# Patient Record
Sex: Male | Born: 1985 | Race: White | Hispanic: No | Marital: Single | Smoking: Current every day smoker
Health system: Southern US, Community
[De-identification: ages and names within clinical notes are randomized; demographics above are authoritative.]

## PROBLEM LIST (undated history)

## (undated) DIAGNOSIS — B019 Varicella without complication: Secondary | ICD-10-CM

## (undated) HISTORY — PX: OTHER SURGICAL HISTORY: SHX169

## (undated) HISTORY — PX: WISDOM TOOTH EXTRACTION: SHX21

## (undated) HISTORY — DX: Varicella without complication: B01.9

---
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
## 2008-06-09 ENCOUNTER — Emergency Department (HOSPITAL_COMMUNITY): Admission: EM | Admit: 2008-06-09 | Discharge: 2008-06-09 | Payer: Self-pay | Admitting: Family Medicine

## 2008-07-13 ENCOUNTER — Emergency Department (HOSPITAL_COMMUNITY): Admission: EM | Admit: 2008-07-13 | Discharge: 2008-07-13 | Payer: Self-pay | Admitting: Emergency Medicine

## 2008-07-17 ENCOUNTER — Emergency Department (HOSPITAL_COMMUNITY): Admission: EM | Admit: 2008-07-17 | Discharge: 2008-07-17 | Payer: Self-pay | Admitting: Emergency Medicine

## 2008-08-27 ENCOUNTER — Emergency Department (HOSPITAL_COMMUNITY): Admission: EM | Admit: 2008-08-27 | Discharge: 2008-08-27 | Payer: Self-pay | Admitting: Emergency Medicine

## 2008-10-18 ENCOUNTER — Emergency Department (HOSPITAL_COMMUNITY): Admission: EM | Admit: 2008-10-18 | Discharge: 2008-10-18 | Payer: Self-pay | Admitting: Emergency Medicine

## 2008-10-30 ENCOUNTER — Emergency Department (HOSPITAL_COMMUNITY): Admission: EM | Admit: 2008-10-30 | Discharge: 2008-10-30 | Payer: Self-pay | Admitting: Emergency Medicine

## 2009-01-29 ENCOUNTER — Emergency Department (HOSPITAL_COMMUNITY): Admission: EM | Admit: 2009-01-29 | Discharge: 2009-01-29 | Payer: Self-pay | Admitting: Emergency Medicine

## 2009-11-02 ENCOUNTER — Emergency Department (HOSPITAL_COMMUNITY): Admission: EM | Admit: 2009-11-02 | Discharge: 2009-11-02 | Payer: Self-pay | Admitting: Emergency Medicine

## 2009-11-13 ENCOUNTER — Emergency Department (HOSPITAL_COMMUNITY): Admission: EM | Admit: 2009-11-13 | Discharge: 2009-11-14 | Payer: Self-pay | Admitting: Emergency Medicine

## 2010-09-27 LAB — RAPID URINE DRUG SCREEN, HOSP PERFORMED
Amphetamines: NOT DETECTED
Barbiturates: NOT DETECTED
Benzodiazepines: NOT DETECTED
Cocaine: NOT DETECTED
Cocaine: NOT DETECTED
Opiates: POSITIVE — AB
Opiates: NOT DETECTED

## 2010-09-27 LAB — CBC
HCT: 45.1 % (ref 39.0–52.0)
Hemoglobin: 15.3 g/dL (ref 13.0–17.0)
RBC: 4.73 MIL/uL (ref 4.22–5.81)
RBC: 4.52 MIL/uL (ref 4.22–5.81)
RDW: 12.6 % (ref 11.5–15.5)
WBC: 5.4 10*3/uL (ref 4.0–10.5)

## 2010-09-27 LAB — COMPREHENSIVE METABOLIC PANEL
ALT: 23 U/L (ref 0–53)
ALT: 17 U/L (ref 0–53)
AST: 21 U/L (ref 0–37)
Alkaline Phosphatase: 65 U/L (ref 39–117)
Alkaline Phosphatase: 57 U/L (ref 39–117)
BUN: 13 mg/dL (ref 6–23)
Chloride: 106 mEq/L (ref 96–112)
GFR calc Af Amer: >60 mL/min (ref >60)
Glucose, Bld: 106 mg/dL — ABNORMAL HIGH (ref 70–99)
Glucose, Bld: 108 mg/dL — ABNORMAL HIGH (ref 70–99)
Potassium: 4 mEq/L (ref 3.5–5.1)
Sodium: 140 mEq/L (ref 135–145)
Total Bilirubin: 0.9 mg/dL (ref 0.3–1.2)
Total Bilirubin: 0.4 mg/dL (ref 0.3–1.2)
Total Protein: 7.7 g/dL (ref 6.0–8.3)

## 2010-09-27 LAB — DIFFERENTIAL
Basophils Absolute: 0 10*3/uL (ref 0.0–0.1)
Basophils Relative: 1 % (ref 0–1)
Eosinophils Absolute: 0.1 10*3/uL (ref 0.0–0.7)
Lymphocytes Relative: 28 % (ref 12–46)
Lymphs Abs: 1.9 10*3/uL (ref 0.7–4.0)
Monocytes Absolute: 0.4 10*3/uL (ref 0.1–1.0)
Neutro Abs: 4.5 10*3/uL (ref 1.7–7.7)
Neutro Abs: 3.1 10*3/uL (ref 1.7–7.7)
Neutrophils Relative %: 65 % (ref 43–77)
Neutrophils Relative %: 57 % (ref 43–77)

## 2010-09-27 LAB — URINALYSIS, ROUTINE W REFLEX MICROSCOPIC
Glucose, UA: NEGATIVE mg/dL
Ketones, ur: NEGATIVE mg/dL
Specific Gravity, Urine: 1.023 (ref 1.005–1.030)
pH: 7 (ref 5.0–8.0)

## 2010-09-27 LAB — ETHANOL
Alcohol, Ethyl (B): <5 mg/dL (ref 0–10)
Alcohol, Ethyl (B): <5 mg/dL (ref 0–10)

## 2010-10-29 ENCOUNTER — Emergency Department (HOSPITAL_COMMUNITY)
Admission: EM | Admit: 2010-10-29 | Discharge: 2010-10-29 | Disposition: A | Discharge status: Home / Self Care | Payer: Self-pay | Attending: Emergency Medicine | Admitting: Emergency Medicine

## 2010-10-29 DIAGNOSIS — X58XXXA Exposure to other specified factors, initial encounter: Secondary | ICD-10-CM | POA: Insufficient documentation

## 2010-10-29 DIAGNOSIS — S139XXA Sprain of joints and ligaments of unspecified parts of neck, initial encounter: Secondary | ICD-10-CM | POA: Insufficient documentation

## 2010-10-29 DIAGNOSIS — M542 Cervicalgia: Secondary | ICD-10-CM | POA: Insufficient documentation
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
## 2011-02-14 ENCOUNTER — Emergency Department (HOSPITAL_COMMUNITY)
Admission: EM | Admit: 2011-02-14 | Discharge: 2011-02-14 | Discharge status: Against Medical Advice | Payer: Self-pay | Attending: Emergency Medicine | Admitting: Emergency Medicine

## 2011-04-23 ENCOUNTER — Inpatient Hospital Stay (INDEPENDENT_AMBULATORY_CARE_PROVIDER_SITE_OTHER)
Admission: RE | Admit: 2011-04-23 | Discharge: 2011-04-23 | Disposition: A | Discharge status: Home / Self Care | Payer: Self-pay | Source: Ambulatory Visit | Attending: Family Medicine | Admitting: Family Medicine

## 2011-04-23 ENCOUNTER — Ambulatory Visit (INDEPENDENT_AMBULATORY_CARE_PROVIDER_SITE_OTHER): Payer: Self-pay

## 2011-04-23 DIAGNOSIS — S8000XA Contusion of unspecified knee, initial encounter: Secondary | ICD-10-CM

## 2011-04-23 DIAGNOSIS — S93409A Sprain of unspecified ligament of unspecified ankle, initial encounter: Secondary | ICD-10-CM

## 2011-12-20 ENCOUNTER — Emergency Department (HOSPITAL_COMMUNITY)
Admission: EM | Admit: 2011-12-20 | Discharge: 2011-12-20 | Discharge status: Against Medical Advice | Payer: Self-pay | Attending: Emergency Medicine | Admitting: Emergency Medicine

## 2011-12-20 DIAGNOSIS — R109 Unspecified abdominal pain: Secondary | ICD-10-CM | POA: Insufficient documentation

## 2011-12-20 DIAGNOSIS — K5900 Constipation, unspecified: Secondary | ICD-10-CM | POA: Insufficient documentation

## 2011-12-20 NOTE — ED Notes (Signed)
abd pain, "mainly in the mornings and i feel like i'm going to throw up"  Pt is on methadone-states after he takes it, he starts to feel better.  Pt also c/o constipation-LBM was 5 hrs ago. States it's very hard and very little.

## 2011-12-20 NOTE — ED Notes (Signed)
Pt has been called in the lobby.  There is no sign of the pt in the lobby.

## 2013-05-28 ENCOUNTER — Emergency Department (HOSPITAL_COMMUNITY)
Admission: EM | Admit: 2013-05-28 | Discharge: 2013-05-28 | Disposition: A | Discharge status: Home / Self Care | Payer: Self-pay | Attending: Emergency Medicine | Admitting: Emergency Medicine

## 2013-05-28 ENCOUNTER — Emergency Department (HOSPITAL_COMMUNITY): Payer: Self-pay

## 2013-05-28 DIAGNOSIS — R209 Unspecified disturbances of skin sensation: Secondary | ICD-10-CM | POA: Insufficient documentation

## 2013-05-28 DIAGNOSIS — M436 Torticollis: Secondary | ICD-10-CM | POA: Insufficient documentation

## 2013-05-28 MED ORDER — IBUPROFEN 800 MG PO TABS: 800.0000 mg | ORAL_TABLET | Freq: Three times a day (TID) | ORAL | Status: DC

## 2013-05-28 MED ORDER — METHOCARBAMOL 500 MG PO TABS: 500.0000 mg | ORAL_TABLET | Freq: Two times a day (BID) | ORAL | Status: DC

## 2013-05-28 MED ORDER — HYDROCODONE-ACETAMINOPHEN 5-325 MG PO TABS
2.0000 | ORAL_TABLET | Freq: Once | ORAL | Status: AC
  Administered 2013-05-28: 2 via ORAL
  Filled 2013-05-28: qty 2

## 2013-05-28 MED ORDER — HYDROCODONE-ACETAMINOPHEN 5-325 MG PO TABS: 2.0000 | ORAL_TABLET | ORAL | Status: DC | PRN

## 2013-05-28 NOTE — ED Provider Notes (Signed)
CSN: [id number]     Arrival date & time 05/28/13  1554 History  This chart was scribed for non-physician practitioner Ok Edwards, PA-C  working with Shon Baton, MD by Leone Payor, ED Scribe. This patient was seen in room WTR8/WTR8 and the patient's care was started at 1554.   Chief Complaint  Patient presents with  . Shoulder Pain    Patient is a 27 y.o. male presenting with shoulder pain. The history is provided by the patient. No language interpreter was used.  Shoulder Pain This is a new problem. The current episode started more than 2 days ago. The problem occurs constantly. The problem has not changed since onset.Nothing aggravates the symptoms. Nothing relieves the symptoms.    HPI Comments: Frank Chen is a 27 y.o. male who presents to the Emergency Department complaining of sudden onset, constant, unchanged right shoulder and neck pain that began 3 days ago. Pt states he moved his head too quickly and now feels as though he has a "crick" in his neck. Pt states he has associated tingling sensations to the right thumb and right index fingers. He has taken OTC pain medication without relief. He denies weakness.    No past medical history on file. No past surgical history on file. No family history on file. History  Substance Use Topics  . Smoking status: Not on file  . Smokeless tobacco: Not on file  . Alcohol Use: Not on file    Review of Systems  Musculoskeletal: Positive for arthralgias (right shoulder pain) and neck pain (right sided).  Neurological: Positive for numbness. Negative for weakness.  All other systems reviewed and are negative.    Allergies  Review of patient's allergies indicates no known allergies.  Home Medications   Current Outpatient Rx  Name  Route  Sig  Dispense  Refill  . aspirin-acetaminophen-caffeine (EXCEDRIN MIGRAINE) 250-250-65 MG per tablet   Oral   Take 2 tablets by mouth every 6 (six) hours as needed for headache  (pain).          BP 130/79  Pulse 80  Temp(Src) 98.5 F (36.9 C) (Oral)  Resp 16  SpO2 100% Physical Exam  Nursing note and vitals reviewed. Constitutional: He is oriented to person, place, and time. He appears well-developed and well-nourished.  HENT:  Head: Normocephalic.  Eyes: EOM are normal.  Neck: Normal range of motion.  Pulmonary/Chest: Effort normal.  Abdominal: He exhibits no distension.  Musculoskeletal:  Muscle spasm to right trapezius. Decreased cervical spine motion to the right. Full ROM of the right shoulder. Good pulses. NVI.   Neurological: He is alert and oriented to person, place, and time.  Psychiatric: He has a normal mood and affect.    ED Course  Procedures   DIAGNOSTIC STUDIES: Oxygen Saturation is 100% on RA, normal by my interpretation.    COORDINATION OF CARE: 4:18 PM Will order XRAYs Discussed treatment plan with pt at bedside and pt agreed to plan.   Labs Review Labs Reviewed - No data to display Imaging Review No results found.  EKG Interpretation   None     xrays normal.   Pt advised to follow up with Dr.   MDM   1. Torticollis         I personally performed the services in this documentation, which was scribed in my presence.  The recorded information has been reviewed and considered.   Barnet Pall.     Elson Areas, PA-C 05/28/13  [address]  Lonia Skinner [place], PA-C 05/28/13 1723

## 2013-05-28 NOTE — ED Notes (Signed)
Pt states he had a "crick" in his neck for the past 3 days. States now he has pain to his R shoulder and numbness to his R thumb and index finger. ROM intact to R shoulder. Pt denies trauma to shoulder. Pt states he has taken Excedrin XL for pain. Pt ambulatory to exam room with steady gait.

## 2013-05-28 NOTE — ED Notes (Signed)
Pt has a ride home.  

## 2013-05-29 NOTE — ED Provider Notes (Signed)
Medical screening examination/treatment/procedure(s) were performed by non-physician practitioner and as supervising physician I was immediately available for consultation/collaboration.  EKG Interpretation   None        Shon Baton, MD 05/29/13 1238

## 2013-06-07 ENCOUNTER — Encounter (HOSPITAL_COMMUNITY): Payer: Self-pay | Admitting: Emergency Medicine

## 2013-06-07 ENCOUNTER — Emergency Department (HOSPITAL_COMMUNITY)
Admission: EM | Admit: 2013-06-07 | Discharge: 2013-06-07 | Disposition: A | Discharge status: Home / Self Care | Payer: Self-pay | Attending: Emergency Medicine | Admitting: Emergency Medicine

## 2013-06-07 ENCOUNTER — Emergency Department (HOSPITAL_COMMUNITY): Payer: Self-pay

## 2013-06-07 DIAGNOSIS — R209 Unspecified disturbances of skin sensation: Secondary | ICD-10-CM | POA: Insufficient documentation

## 2013-06-07 DIAGNOSIS — Y939 Activity, unspecified: Secondary | ICD-10-CM | POA: Insufficient documentation

## 2013-06-07 DIAGNOSIS — R269 Unspecified abnormalities of gait and mobility: Secondary | ICD-10-CM | POA: Insufficient documentation

## 2013-06-07 DIAGNOSIS — Z79899 Other long term (current) drug therapy: Secondary | ICD-10-CM | POA: Insufficient documentation

## 2013-06-07 DIAGNOSIS — S43401A Unspecified sprain of right shoulder joint, initial encounter: Secondary | ICD-10-CM

## 2013-06-07 DIAGNOSIS — W138XXA Fall from, out of or through other building or structure, initial encounter: Secondary | ICD-10-CM | POA: Insufficient documentation

## 2013-06-07 DIAGNOSIS — S7010XA Contusion of unspecified thigh, initial encounter: Secondary | ICD-10-CM | POA: Insufficient documentation

## 2013-06-07 DIAGNOSIS — W19XXXA Unspecified fall, initial encounter: Secondary | ICD-10-CM

## 2013-06-07 DIAGNOSIS — F17200 Nicotine dependence, unspecified, uncomplicated: Secondary | ICD-10-CM | POA: Insufficient documentation

## 2013-06-07 DIAGNOSIS — Y92009 Unspecified place in unspecified non-institutional (private) residence as the place of occurrence of the external cause: Secondary | ICD-10-CM | POA: Insufficient documentation

## 2013-06-07 DIAGNOSIS — S8012XA Contusion of left lower leg, initial encounter: Secondary | ICD-10-CM

## 2013-06-07 DIAGNOSIS — IMO0002 Reserved for concepts with insufficient information to code with codable children: Secondary | ICD-10-CM | POA: Insufficient documentation

## 2013-06-07 MED ORDER — HYDROCODONE-ACETAMINOPHEN 5-325 MG PO TABS: 1.0000 | ORAL_TABLET | ORAL | Status: DC | PRN

## 2013-06-07 MED ORDER — HYDROCODONE-ACETAMINOPHEN 5-325 MG PO TABS
2.0000 | ORAL_TABLET | Freq: Once | ORAL | Status: AC
  Administered 2013-06-07: 2 via ORAL
  Filled 2013-06-07: qty 2
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
## 2013-06-08 NOTE — ED Provider Notes (Signed)
Medical screening examination/treatment/procedure(s) were performed by non-physician practitioner and as supervising physician I was immediately available for consultation/collaboration.  EKG Interpretation   None         Alayza Pieper N Shawny Borkowski, DO 06/08/13 0106 

## 2013-06-20 ENCOUNTER — Emergency Department (HOSPITAL_COMMUNITY)
Admission: EM | Admit: 2013-06-20 | Discharge: 2013-06-20 | Disposition: A | Discharge status: Home / Self Care | Payer: Self-pay | Attending: Emergency Medicine | Admitting: Emergency Medicine

## 2013-06-20 ENCOUNTER — Encounter (HOSPITAL_COMMUNITY): Payer: Self-pay | Admitting: Emergency Medicine

## 2013-06-20 ENCOUNTER — Emergency Department (HOSPITAL_COMMUNITY): Payer: Self-pay

## 2013-06-20 DIAGNOSIS — M25519 Pain in unspecified shoulder: Secondary | ICD-10-CM | POA: Insufficient documentation

## 2013-06-20 DIAGNOSIS — F17200 Nicotine dependence, unspecified, uncomplicated: Secondary | ICD-10-CM | POA: Insufficient documentation

## 2013-06-20 DIAGNOSIS — M542 Cervicalgia: Secondary | ICD-10-CM | POA: Insufficient documentation

## 2013-06-20 DIAGNOSIS — Z7982 Long term (current) use of aspirin: Secondary | ICD-10-CM | POA: Insufficient documentation

## 2013-06-20 DIAGNOSIS — R209 Unspecified disturbances of skin sensation: Secondary | ICD-10-CM | POA: Insufficient documentation

## 2013-06-20 DIAGNOSIS — M25511 Pain in right shoulder: Secondary | ICD-10-CM

## 2013-06-20 DIAGNOSIS — Z79899 Other long term (current) drug therapy: Secondary | ICD-10-CM | POA: Insufficient documentation

## 2013-06-20 DIAGNOSIS — G8911 Acute pain due to trauma: Secondary | ICD-10-CM | POA: Insufficient documentation

## 2013-06-20 DIAGNOSIS — M791 Myalgia, unspecified site: Secondary | ICD-10-CM

## 2013-06-20 MED ORDER — METHOCARBAMOL 500 MG PO TABS: 500.0000 mg | ORAL_TABLET | Freq: Two times a day (BID) | ORAL | Status: DC

## 2013-06-20 NOTE — ED Provider Notes (Signed)
CSN: [id number]     Arrival date & time 06/20/13  1453 History  This chart was scribed for non-physician practitioner, Raymon Mutton, PA-C,working with Flint Melter, MD, by Karle Plumber, ED Scribe.  This patient was seen in room TR07C/TR07C and the patient's care was started at 4:06 PM.  Chief Complaint  Patient presents with  . Shoulder Pain   The history is provided by the patient. No language interpreter was used.   HPI Comments:  Frank Chen is a 27 y.o. male who presents to the Emergency Department complaining of constant, worsening, throbbing right-sided neck pain and right arm pain for the past two days. Pt states he fell through the attic on Thanksgiving Day and was seen here and discharged with a referral to an orthopedist in which he has not followed up. His previous X-Rays were negative for fracture. He states the pain is centralized to the right-side of his neck and goes down his right arm. He reports numbness in his right index finger and thumb. Reported that the pain worsens with stretching, stated that he feels a tightness, pulling sensation. Stated that he feels like he "strained" something. Pt states he ran out of Vicodin 4-5 days ago and has been taking Ibuprofen for the pain since then. He reports alternating a heating pad with ice packs. He denies dizziness, confusion, loss of sensation, or vision changes, weakness. PCP none   History reviewed. No pertinent past medical history. Past Surgical History  Procedure Laterality Date  . Right pin placement     No family history on file. History  Substance Use Topics  . Smoking status: Current Every Day Smoker  . Smokeless tobacco: Not on file  . Alcohol Use: No    Review of Systems  Eyes: Negative for visual disturbance.  Musculoskeletal: Positive for myalgias (right arm, right shoulder, and right-side of neck) and neck pain (right-sided).  Neurological: Negative for dizziness.  Psychiatric/Behavioral: Negative  for confusion.  All other systems reviewed and are negative.    Allergies  Review of patient's allergies indicates no known allergies.  Home Medications   Current Outpatient Rx  Name  Route  Sig  Dispense  Refill  . aspirin-acetaminophen-caffeine (EXCEDRIN MIGRAINE) 250-250-65 MG per tablet   Oral   Take 2 tablets by mouth every 6 (six) hours as needed for headache (pain).         Marland Kitchen HYDROcodone-acetaminophen (NORCO/VICODIN) 5-325 MG per tablet   Oral   Take 1-2 tablets by mouth every 4 (four) hours as needed.   10 tablet   0   . ibuprofen (ADVIL,MOTRIN) 800 MG tablet   Oral   Take 1 tablet (800 mg total) by mouth 3 (three) times daily.   21 tablet   0   . methocarbamol (ROBAXIN) 500 MG tablet   Oral   Take 1 tablet (500 mg total) by mouth 2 (two) times daily.   20 tablet   0   . methocarbamol (ROBAXIN) 500 MG tablet   Oral   Take 1 tablet (500 mg total) by mouth 2 (two) times daily.   15 tablet   0    Triage Vitals: BP 133/79  Pulse 99  Temp(Src) 97.8 F (36.6 C) (Oral)  Resp 19  Wt 160 lb (72.576 kg)  SpO2 97% Physical Exam  Nursing note and vitals reviewed. Constitutional: He is oriented to person, place, and time. He appears well-developed and well-nourished.  HENT:  Head: Normocephalic and atraumatic.  Neck: Normal range  of motion. Neck supple.    Negative pain upon palpation to the C-spine Negative neck stiffness Negative nuchal rigidity Discomfort upon palpation to the right side the neck - muscular in nature  Cardiovascular: Normal rate, regular rhythm and normal heart sounds.  Exam reveals no friction rub.   No murmur heard. Pulses:      Radial pulses are 2+ on the right side, and 2+ on the left side.  Pulmonary/Chest: Effort normal and breath sounds normal. No respiratory distress. He has no wheezes. He has no rales.  Musculoskeletal: Normal range of motion. He exhibits tenderness.       Arms: Full range of motion to upper and lower  extremities bilaterally without difficulty Negative swelling, erythema, ecchymosis, sunken in appearance, deformities noted to the right shoulder. Full abduction, abduction, flexion, extension, inversion, eversion of the right shoulder identified. Discomfort upon palpation to the right trapezius, right side of the neck, posterior and anterior aspect of the right shoulder, deltoid circumferential-muscular in nature. Negative drop arm. Full range of motion to right upper extremity.  Neurological: He is alert and oriented to person, place, and time. No cranial nerve deficit. He exhibits normal muscle tone. Coordination normal.  Cranial nerves III through XII grossly intact Strength 5+5+ upper extremities bilaterally with resistance applied, equal distribution noted Negative drop arm Sensation intact with differentiation to sharp and dull touch to bilateral upper extremities Strength intact MCP, PIP, DIP joints of the right hand  Skin: Skin is warm and dry. No rash noted. No erythema.  Psychiatric: He has a normal mood and affect. His behavior is normal.    ED Course  Procedures (including critical care time) DIAGNOSTIC STUDIES: Oxygen Saturation is 97% on RA, normal by my interpretation.   COORDINATION OF CARE: 4:13 PM- Advised pt to stretch and use his arm as tolerated. Advised pt to continue to take Ibuprofen for pain. Will give pt prescription for muscle relaxer. Pt verbalizes understanding and agrees to plan.  Medications - No data to display  Dg Cervical Spine Complete  06/20/2013   CLINICAL DATA:  Patient fell through the roof of his house approximately 2 weeks week ago, presenting now with worsening neck pain.  EXAM: CERVICAL SPINE  4+ VIEWS  COMPARISON:  05/28/2013.  FINDINGS: Slight straightening of the usual cervical lordosis. Anatomic alignment. No visible fractures. Well preserved disc spaces. Normal prevertebral soft tissues. Facet joints intact. No significant bony foraminal  stenoses. No static evidence of instability.  IMPRESSION: Slight straightening of the usual lordosis which may reflect positioning and/or spasm. Otherwise normal examination.   Electronically Signed   By: Hulan Saas M.D.   On: 06/20/2013 16:07   Dg Cervical Spine Complete  05/28/2013   CLINICAL DATA:  Right-sided neck into the right shoulder for 3 days.  EXAM: CERVICAL SPINE  4+ VIEWS  COMPARISON:  None.  FINDINGS: There is no evidence of cervical spine fracture or prevertebral soft tissue swelling. Alignment is normal. No other significant bone abnormalities are identified.  IMPRESSION: Negative cervical spine radiographs.   Electronically Signed   By: Elige Ko   On: 05/28/2013 16:45   Dg Shoulder Right  06/20/2013   CLINICAL DATA:  Patient fell through the roof of his house approximately 2 weeks week ago, presenting now with worsening right shoulder pain and decreased range of motion.  EXAM: RIGHT SHOULDER - 2+ VIEW  COMPARISON:  06/07/2013.  FINDINGS: No evidence of acute or subacute fracture or glenohumeral dislocation. Subacromial space well preserved. Acromioclavicular  joint intact. No intrinsic osseous abnormality. No significant interval change.  IMPRESSION: Normal and stable examination.   Electronically Signed   By: Hulan Saas M.D.   On: 06/20/2013 16:05   Dg Shoulder Right  06/07/2013   CLINICAL DATA:  Fall  EXAM: RIGHT SHOULDER - 2+ VIEW  COMPARISON:  None.  FINDINGS: No acute fracture.  No dislocation.  IMPRESSION: No acute bony pathology.   Electronically Signed   By: Maryclare Bean M.D.   On: 06/07/2013 17:27   Dg Femur Left  06/07/2013   CLINICAL DATA:  Pain after fall.  EXAM: LEFT FEMUR - 2 VIEW  COMPARISON:  None.  FINDINGS: There is no evidence of fracture or other focal bone lesions. Soft tissues are unremarkable.  IMPRESSION: Negative.   Electronically Signed   By: Maisie Fus  Register   On: 06/07/2013 17:31   Labs Review Labs Reviewed - No data to display Imaging  Review Dg Cervical Spine Complete  06/20/2013   CLINICAL DATA:  Patient fell through the roof of his house approximately 2 weeks week ago, presenting now with worsening neck pain.  EXAM: CERVICAL SPINE  4+ VIEWS  COMPARISON:  05/28/2013.  FINDINGS: Slight straightening of the usual cervical lordosis. Anatomic alignment. No visible fractures. Well preserved disc spaces. Normal prevertebral soft tissues. Facet joints intact. No significant bony foraminal stenoses. No static evidence of instability.  IMPRESSION: Slight straightening of the usual lordosis which may reflect positioning and/or spasm. Otherwise normal examination.   Electronically Signed   By: Hulan Saas M.D.   On: 06/20/2013 16:07   Dg Shoulder Right  06/20/2013   CLINICAL DATA:  Patient fell through the roof of his house approximately 2 weeks week ago, presenting now with worsening right shoulder pain and decreased range of motion.  EXAM: RIGHT SHOULDER - 2+ VIEW  COMPARISON:  06/07/2013.  FINDINGS: No evidence of acute or subacute fracture or glenohumeral dislocation. Subacromial space well preserved. Acromioclavicular joint intact. No intrinsic osseous abnormality. No significant interval change.  IMPRESSION: Normal and stable examination.   Electronically Signed   By: Hulan Saas M.D.   On: 06/20/2013 16:05    EKG Interpretation   None       MDM   1. Muscular pain   2. Right shoulder pain     Filed Vitals:   06/20/13 1459  BP: 133/79  Pulse: 99  Temp: 97.8 F (36.6 C)  TempSrc: Oral  Resp: 19  Weight: 160 lb (72.576 kg)  SpO2: 97%    I personally performed the services described in this documentation, which was scribed in my presence. The recorded information has been reviewed and is accurate.  Patient presenting to emergency department with shoulder pain that has been ongoing since 06/07/2013 where patient fell through the attic on Thanksgiving. Patient was seen and assessed in the emergency department  with negative findings on the right shoulder x-ray - patient was discharged with Robaxin and Vicodin for pain control. Patient reported that the pain to the right shoulder exacerbated approximately 2 days ago described as a pulling, stretching sensation. Alert and oriented. GCS 15. Heart rate and rhythm normal. Pulses palpable and strong, radial 2+ bilaterally. Negative swelling, erythema, ecchymosis, deformities noted to the right shoulder. Full range of motion to the shoulder-right shoulder. Negative drop arm. Discomfort noted upon palpation to the right shoulder, right trapezius, right side the neck- muscular in nature. Strength intact with resistance applied, equal distribution noted. Sensation intact to bilateral upper extremities. Strength intact  to MCP, PIP, DIP joints of the right hand. Negative neurological deficits identified. Plain film of the cervical spine noted muscle spasm secondary to display straightening of the lordosis, negative acute findings. Plain film of right shoulder normal and stable, negative acute findings. Suspicion to be muscular discomfort in nature. Doubt new injury. Doubt fracture. Doubt bone contusion. Patient neurovascularly intact. Patient stable, afebrile. Discharged patient with muscle relaxers. [facility]. Discussed with patient to apply heat, icy hot ointment and massage, discussed slow circular motions to prevent frozen shoulder joint. Discussed with patient to closely monitor symptoms and if symptoms are to worsen or change to report back to emergency department - strict return instructions given. Patient agreed to plan of care, understood, all cautions answered.  Raymon Mutton, PA-C 06/21/13 0147

## 2013-06-20 NOTE — ED Notes (Addendum)
Pt states he fell through the attic on Thanksgiving day.  Pt was treated on 11/29.  Pt presents today with c/o worsening R neck and R arm pain.

## 2013-06-22 NOTE — ED Provider Notes (Signed)
Medical screening examination/treatment/procedure(s) were performed by non-physician practitioner and as supervising physician I was immediately available for consultation/collaboration.  Flint Melter, MD 06/22/13 [phone]

## 2015-04-14 ENCOUNTER — Ambulatory Visit (INDEPENDENT_AMBULATORY_CARE_PROVIDER_SITE_OTHER)
Admission: RE | Admit: 2015-04-14 | Discharge: 2015-04-14 | Disposition: A | Discharge status: Home / Self Care | Payer: Managed Care, Other (non HMO) | Source: Ambulatory Visit | Attending: Internal Medicine | Admitting: Internal Medicine

## 2015-04-14 ENCOUNTER — Ambulatory Visit (INDEPENDENT_AMBULATORY_CARE_PROVIDER_SITE_OTHER): Payer: Managed Care, Other (non HMO) | Admitting: Internal Medicine

## 2015-04-14 ENCOUNTER — Encounter: Payer: Self-pay | Admitting: Internal Medicine

## 2015-04-14 VITALS — BP 124/76 | HR 106 | Temp 98.4°F | Ht 72.5 in | Wt 154.0 lb

## 2015-04-14 DIAGNOSIS — M79604 Pain in right leg: Secondary | ICD-10-CM

## 2015-04-14 MED ORDER — HYDROCODONE-ACETAMINOPHEN 5-325 MG PO TABS: 1.0000 | ORAL_TABLET | Freq: Two times a day (BID) | ORAL | Status: AC | PRN

## 2015-04-14 NOTE — Patient Instructions (Signed)
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
## 2015-04-14 NOTE — Progress Notes (Signed)
Pre visit review using our clinic review tool, if applicable. No additional management support is needed unless otherwise documented below in the visit note. 

## 2015-11-01 IMAGING — CR DG FEMUR 2+V*R*
4 series · 4 of 4 positions shown · non-contrast
Comparison: None.

CLINICAL DATA: Chronic right femur pain.

EXAM:
RIGHT FEMUR 2 VIEWS

[view not recorded (1 of 4)]
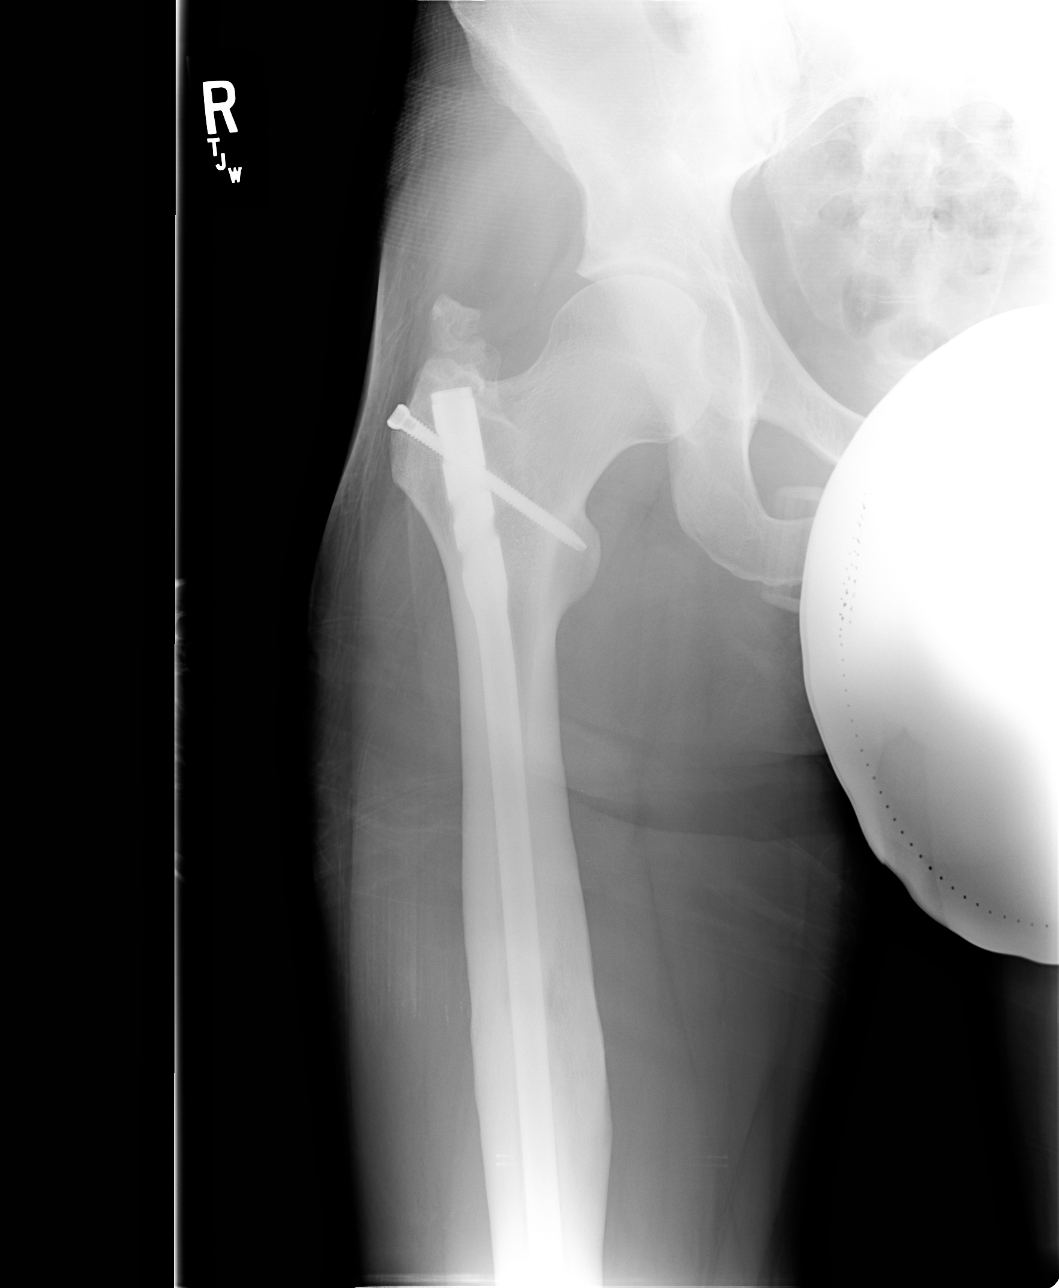

[view not recorded (2 of 4)]
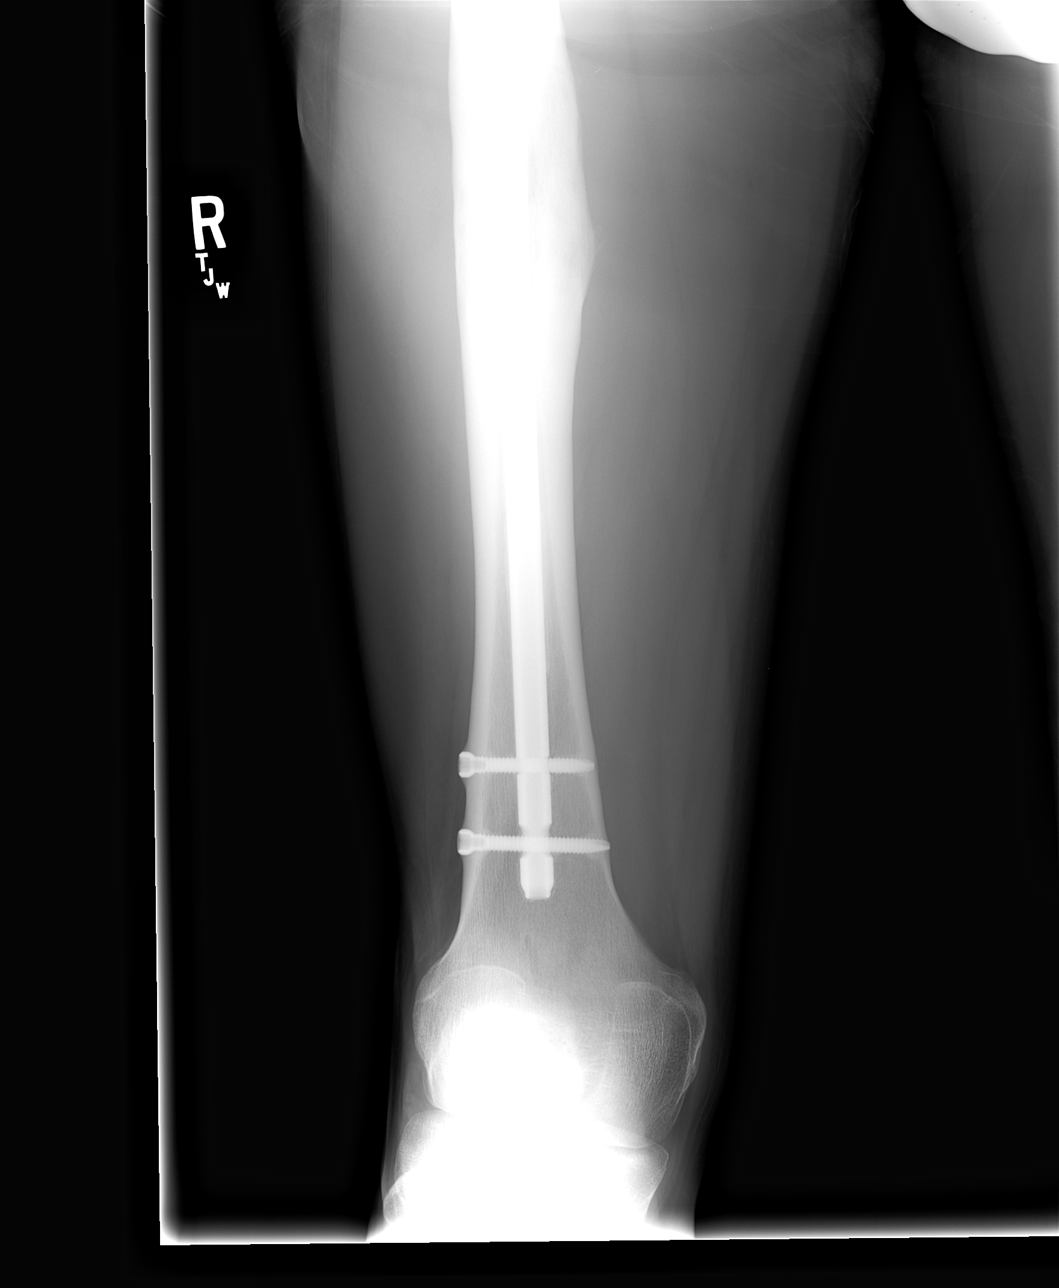

[view not recorded (3 of 4)]
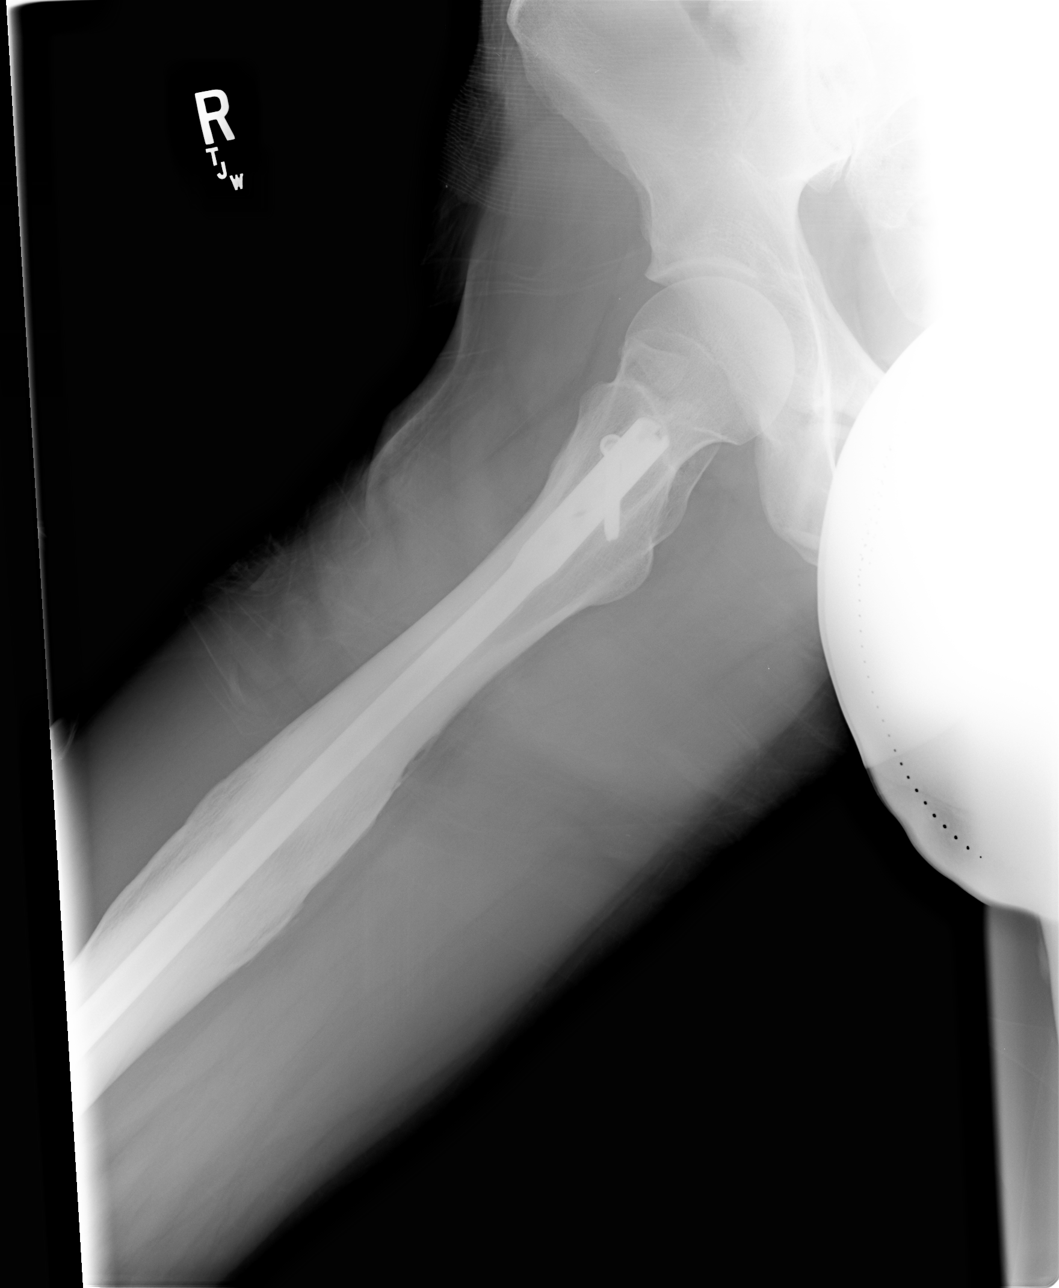

[view not recorded (4 of 4)]
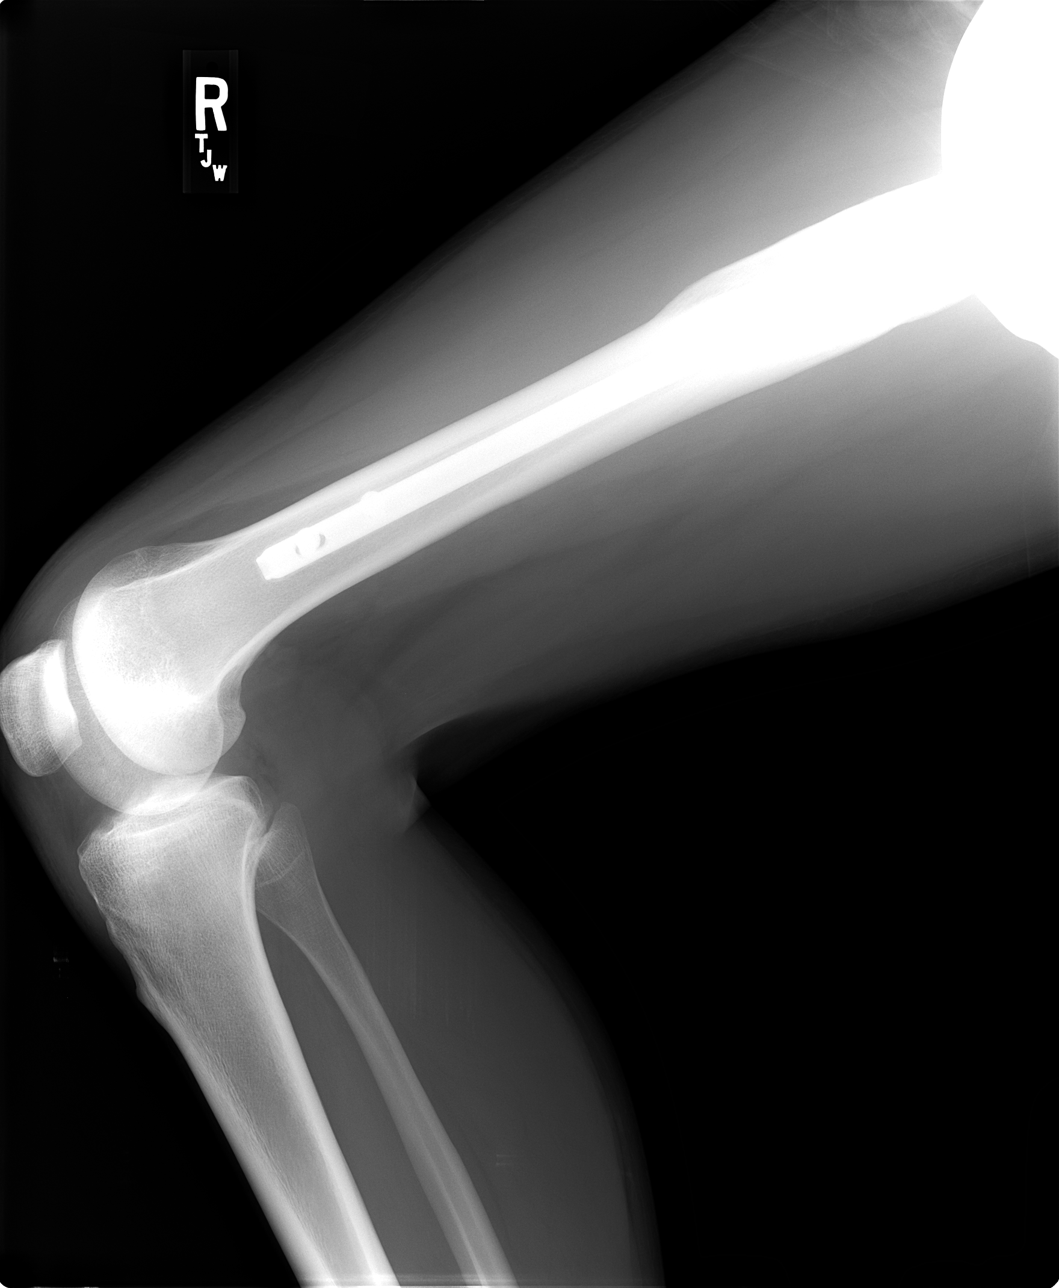

[4 of 4 positions shown; findings below may reference images not displayed]

FINDINGS: Status post intra medullary rod fixation of old healed femoral shaft
fracture. No acute fracture or dislocation is noted. No soft tissue
abnormality is noted.
IMPRESSION: No acute abnormality seen in the right femur.
